# Patient Record
Sex: Female | Born: 2005 | Race: Black or African American | Hispanic: No | Marital: Single
Health system: Southern US, Community
[De-identification: ages and names within clinical notes are randomized; demographics above are authoritative.]

---
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
## 2025-04-22 ENCOUNTER — Ambulatory Visit: Admitting: Dermatology
